# Patient Record
Sex: Male | Born: 1990 | Race: Black or African American | Hispanic: No | Marital: Single | Smoking: Never smoker
Health system: Southern US, Community
[De-identification: ages and names within clinical notes are randomized; demographics above are authoritative.]

## PROBLEM LIST (undated history)

## (undated) DIAGNOSIS — J45909 Unspecified asthma, uncomplicated: Secondary | ICD-10-CM

---
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
## 2013-08-07 ENCOUNTER — Encounter (HOSPITAL_COMMUNITY): Payer: Self-pay | Admitting: Emergency Medicine

## 2013-08-07 ENCOUNTER — Emergency Department (HOSPITAL_COMMUNITY)
Admission: EM | Admit: 2013-08-07 | Discharge: 2013-08-07 | Disposition: A | Discharge status: Home / Self Care | Payer: Managed Care, Other (non HMO) | Attending: Emergency Medicine | Admitting: Emergency Medicine

## 2013-08-07 DIAGNOSIS — J45909 Unspecified asthma, uncomplicated: Secondary | ICD-10-CM | POA: Insufficient documentation

## 2013-08-07 DIAGNOSIS — R630 Anorexia: Secondary | ICD-10-CM | POA: Insufficient documentation

## 2013-08-07 DIAGNOSIS — R5383 Other fatigue: Secondary | ICD-10-CM

## 2013-08-07 DIAGNOSIS — K922 Gastrointestinal hemorrhage, unspecified: Secondary | ICD-10-CM

## 2013-08-07 DIAGNOSIS — R112 Nausea with vomiting, unspecified: Secondary | ICD-10-CM | POA: Insufficient documentation

## 2013-08-07 DIAGNOSIS — R6883 Chills (without fever): Secondary | ICD-10-CM | POA: Insufficient documentation

## 2013-08-07 DIAGNOSIS — R109 Unspecified abdominal pain: Secondary | ICD-10-CM | POA: Insufficient documentation

## 2013-08-07 DIAGNOSIS — R197 Diarrhea, unspecified: Secondary | ICD-10-CM | POA: Insufficient documentation

## 2013-08-07 DIAGNOSIS — R5381 Other malaise: Secondary | ICD-10-CM | POA: Insufficient documentation

## 2013-08-07 HISTORY — DX: Unspecified asthma, uncomplicated: J45.909

## 2013-08-07 LAB — COMPREHENSIVE METABOLIC PANEL
ALBUMIN: 3.7 g/dL (ref 3.5–5.2)
ALT: 29 U/L (ref 0–53)
AST: 33 U/L (ref 0–37)
Alkaline Phosphatase: 76 U/L (ref 39–117)
BUN: 11 mg/dL (ref 6–23)
CALCIUM: 9.3 mg/dL (ref 8.4–10.5)
CO2: 22 meq/L (ref 19–32)
Chloride: 101 mEq/L (ref 96–112)
Creatinine, Ser: 0.87 mg/dL (ref 0.50–1.35)
GFR calc Af Amer: >90 mL/min (ref >90)
GFR calc non Af Amer: >90 mL/min (ref >90)
Glucose, Bld: 88 mg/dL (ref 70–99)
Potassium: 3.9 mEq/L (ref 3.7–5.3)
SODIUM: 137 meq/L (ref 137–147)
TOTAL PROTEIN: 8 g/dL (ref 6.0–8.3)
Total Bilirubin: 0.5 mg/dL (ref 0.3–1.2)

## 2013-08-07 LAB — CBC WITH DIFFERENTIAL/PLATELET
BASOS ABS: 0 10*3/uL (ref 0.0–0.1)
BASOS PCT: 0 % (ref 0–1)
EOS ABS: 0.2 10*3/uL (ref 0.0–0.7)
EOS PCT: 2 % (ref 0–5)
HCT: 43.1 % (ref 39.0–52.0)
Hemoglobin: 14.9 g/dL (ref 13.0–17.0)
LYMPHS PCT: 13 % (ref 12–46)
Lymphs Abs: 1.4 10*3/uL (ref 0.7–4.0)
MCH: 29.5 pg (ref 26.0–34.0)
MCHC: 34.6 g/dL (ref 30.0–36.0)
MCV: 85.3 fL (ref 78.0–100.0)
Monocytes Absolute: 1.2 10*3/uL — ABNORMAL HIGH (ref 0.1–1.0)
Monocytes Relative: 11 % (ref 3–12)
Neutro Abs: 7.8 10*3/uL — ABNORMAL HIGH (ref 1.7–7.7)
Neutrophils Relative %: 74 % (ref 43–77)
PLATELETS: 208 10*3/uL (ref 150–400)
RBC: 5.05 MIL/uL (ref 4.22–5.81)
RDW: 12.9 % (ref 11.5–15.5)
WBC: 10.7 10*3/uL — AB (ref 4.0–10.5)

## 2013-08-07 LAB — POC OCCULT BLOOD, ED: FECAL OCCULT BLD: POSITIVE — AB

## 2013-08-07 MED ORDER — SODIUM CHLORIDE 0.9 % IV BOLUS (SEPSIS)
1000.0000 mL | Freq: Once | INTRAVENOUS | Status: AC
  Administered 2013-08-07: 1000 mL via INTRAVENOUS

## 2013-08-07 MED ORDER — ONDANSETRON HCL 4 MG/2ML IJ SOLN
4.0000 mg | Freq: Once | INTRAMUSCULAR | Status: AC
  Administered 2013-08-07: 4 mg via INTRAVENOUS
  Filled 2013-08-07: qty 2

## 2013-08-07 MED ORDER — SULFAMETHOXAZOLE-TMP DS 800-160 MG PO TABS: 1.0000 | ORAL_TABLET | Freq: Two times a day (BID) | ORAL | Status: DC

## 2013-08-07 MED ORDER — OXYCODONE-ACETAMINOPHEN 5-325 MG PO TABS: 1.0000 | ORAL_TABLET | Freq: Four times a day (QID) | ORAL | Status: AC | PRN

## 2013-08-07 MED ORDER — MORPHINE SULFATE 4 MG/ML IJ SOLN
4.0000 mg | Freq: Once | INTRAMUSCULAR | Status: AC
  Administered 2013-08-07: 4 mg via INTRAVENOUS
  Filled 2013-08-07: qty 1

## 2013-08-07 NOTE — ED Notes (Signed)
Pt tolerated fluids well, no complaints of nausea or stomach pains

## 2013-08-07 NOTE — ED Provider Notes (Signed)
CSN: [id number]     Arrival date & time 08/07/13  1125 History   First MD Initiated Contact with Patient 08/07/13 1243     Chief Complaint  Patient presents with  . Rectal Bleeding  . Abdominal Pain     (Consider location/radiation/quality/duration/timing/severity/associated sxs/prior Treatment) Patient is a 23 y.o. male presenting with hematochezia and abdominal pain. The history is provided by the patient.  Rectal Bleeding Associated symptoms: abdominal pain and vomiting   Abdominal Pain Associated symptoms: chills, diarrhea, fatigue, hematochezia, nausea and vomiting   Associated symptoms: no chest pain and no shortness of breath    patient began with nausea and vomiting around 5 days ago. He also had diarrhea she states was initially drained. No nausea and vomiting is somewhat resolved, however he does have blood in the stool now. He states it is rather bloody but still watery. He also has mid abdominal pain. He states his abdomen is hurting, but he was hungry also. He set some chills without fever. His girlfriend has had a little bit of nausea and vomiting but no diarrhea. No other bleeding.  Past Medical History  Diagnosis Date  . Asthma    History reviewed. No pertinent past surgical history. No family history on file. History  Substance Use Topics  . Smoking status: Never Smoker   . Smokeless tobacco: Not on file  . Alcohol Use: No    Review of Systems  Constitutional: Positive for chills, appetite change and fatigue. Negative for activity change.  Eyes: Negative for pain.  Respiratory: Negative for chest tightness and shortness of breath.   Cardiovascular: Negative for chest pain and leg swelling.  Gastrointestinal: Positive for nausea, vomiting, abdominal pain, diarrhea, blood in stool and hematochezia.  Genitourinary: Negative for flank pain.  Musculoskeletal: Negative for back pain and neck stiffness.  Skin: Negative for rash.  Neurological: Negative for weakness,  numbness and headaches.  Psychiatric/Behavioral: Negative for behavioral problems.      Allergies  Review of patient's allergies indicates no known allergies.  Home Medications   Current Outpatient Rx  Name  Route  Sig  Dispense  Refill  . oxyCODONE-acetaminophen (PERCOCET/ROXICET) 5-325 MG per tablet   Oral   Take 1-2 tablets by mouth every 6 (six) hours as needed for severe pain.   10 tablet   0   . sulfamethoxazole-trimethoprim (BACTRIM DS) 800-160 MG per tablet   Oral   Take 1 tablet by mouth 2 (two) times daily.   6 tablet   0    BP 128/57  Pulse 93  Temp(Src) 97.9 F (36.6 C) (Oral)  Resp 18  SpO2 99% Physical Exam  Nursing note and vitals reviewed. Constitutional: He is oriented to person, place, and time. He appears well-developed and well-nourished.  HENT:  Head: Normocephalic and atraumatic.  Eyes: EOM are normal. Pupils are equal, round, and reactive to light.  Neck: Normal range of motion.  Cardiovascular: Normal rate, regular rhythm and normal heart sounds.   No murmur heard. Pulmonary/Chest: Effort normal and breath sounds normal.  Abdominal: Soft. He exhibits no distension and no mass. There is tenderness. There is no rebound and no guarding.  Mild diffuse abdominal tenderness without rebound guarding. No hernias palpated.  Musculoskeletal: Normal range of motion. He exhibits no edema.  Neurological: He is alert and oriented to person, place, and time. No cranial nerve deficit.  Skin: Skin is warm and dry.  Psychiatric: He has a normal mood and affect.    ED Course  Procedures (including critical care time) Labs Review Labs Reviewed  CBC WITH DIFFERENTIAL - Abnormal; Notable for the following:    WBC 10.7 (*)    Neutro Abs 7.8 (*)    Monocytes Absolute 1.2 (*)    All other components within normal limits  POC OCCULT BLOOD, ED - Abnormal; Notable for the following:    Fecal Occult Bld POSITIVE (*)    All other components within normal limits   COMPREHENSIVE METABOLIC PANEL   Imaging Review No results found.   EKG Interpretation None      MDM   Final diagnoses:  Nausea vomiting and diarrhea  GI bleed    Patient nausea vomiting diarrhea. Has had blood in the stool. The nausea and vomiting has improved over the last 2 days, but the diarrhea remains with some blood. Lab work reassuring. May be infectious diarrhea. Will give some Bactrim. Will have follow with gastroenterology.    Juliet Rude. Rubin Payor, MD 08/07/13 [phone]

## 2013-08-07 NOTE — Progress Notes (Signed)
   CARE MANAGEMENT ED NOTE 08/07/2013  [facility]   Account Number:  [id number]  Date Initiated:  08/07/2013  Documentation initiated by:  Radford PaxFERRERO,Chaley Castellanos  Subjective/Objective Assessment:   Patient presents to Ed with abdominal pain and rectal bleeding     Subjective/Objective Assessment Detail:     Action/Plan:   Action/Plan Detail:   Anticipated DC Date:  08/07/2013     Status Recommendation to Physician:   Result of Recommendation:    Other ED Services  Consult Working Plan    DC Planning Services  Other  PCP issues    Choice offered to / List presented to:            Status of service:  Completed, signed off  ED Comments:   ED Comments Detail:  EDCM spoke to patient at bedside.  Patient confirms he doe snot have a pcp.  EDCM instructed patient to call the phone number on the back of his insurance card or go to insurance company website to help him find a physician who is close to him and within network.  Patient verbalized understanding.  No further EDCM needs at this time.

## 2013-08-07 NOTE — Discharge Instructions (Signed)
Bloody Diarrhea  Bloody diarrhea can be caused by many different conditions. Most of the time bloody diarrhea is the result of food poisoning or minor infections. Bloody diarrhea usually improves over 2 to 3 days of rest and fluid replacement. Other conditions that can cause bloody diarrhea include:   Internal bleeding.   Infection.   Diseases of the bowel and colon.  Internal bleeding from an ulcer or bowel disease can be severe and requires hospital care or even surgery.  DIAGNOSIS   To find out what is wrong your caregiver may check your:   Stool.   Blood.   Results from a test that looks inside the body (endoscopy).  TREATMENT    Get plenty of rest.   Drink enough water and fluids to keep your urine clear or pale yellow.   Do not smoke.   Solid foods and dairy products should be avoided until your illness improves.   As you improve, slowly return to a regular diet with easily-digested foods first. Examples are:   Bananas.   Rice.   Toast.   Crackers.  You should only need these for about 2 days before adding more normal foods to your diet.   Avoid spicy or fatty foods as well as caffeine and alcohol for several days.   Medicine to control cramping and diarrhea can relieve symptoms but may prolong some cases of bloody diarrhea. Antibiotics can speed recovery from diarrhea due to some bacterial infections. Call your caregiver if diarrhea does not get better in 3 days.  SEEK MEDICAL CARE IF:    You do not improve after 3 days.   Your diarrhea improves but your stool appears black.  SEEK IMMEDIATE MEDICAL CARE IF:    You become extremely weak or faint.   You become very sweaty.   You have increased pain or bleeding.   You develop repeated vomiting.   You vomit and you see blood or the vomit looks black in color.   You have a fever.  Document Released: 04/24/2005 Document Revised: 07/17/2011 Document Reviewed: 03/26/2009  [employer] Patient Information 2014 [employer].

## 2013-08-07 NOTE — ED Notes (Signed)
Pt c/o abd pain x 4 days and rectal bleeding (bright red) x 2 days.  No hx of abd problems or surgery. Pt also c/o n/v/d. Last vomited 2 days ago

## 2013-08-07 NOTE — ED Notes (Signed)
Initial Contact - Pt given gingerale, tolerating well at this time. Denies further needs/complaints.  NAD.

## 2013-10-26 ENCOUNTER — Encounter (HOSPITAL_COMMUNITY): Payer: Self-pay | Admitting: Emergency Medicine

## 2013-10-26 ENCOUNTER — Emergency Department (HOSPITAL_COMMUNITY)
Admission: EM | Admit: 2013-10-26 | Discharge: 2013-10-27 | Disposition: A | Discharge status: Home / Self Care | Payer: Managed Care, Other (non HMO) | Attending: Emergency Medicine | Admitting: Emergency Medicine

## 2013-10-26 DIAGNOSIS — J45909 Unspecified asthma, uncomplicated: Secondary | ICD-10-CM | POA: Insufficient documentation

## 2013-10-26 DIAGNOSIS — IMO0001 Reserved for inherently not codable concepts without codable children: Secondary | ICD-10-CM | POA: Insufficient documentation

## 2013-10-26 DIAGNOSIS — L03119 Cellulitis of unspecified part of limb: Principal | ICD-10-CM

## 2013-10-26 DIAGNOSIS — L03116 Cellulitis of left lower limb: Secondary | ICD-10-CM

## 2013-10-26 DIAGNOSIS — L02419 Cutaneous abscess of limb, unspecified: Secondary | ICD-10-CM | POA: Insufficient documentation

## 2013-10-26 NOTE — ED Notes (Signed)
patient c/o left knee pain, denies injury. Patient c/o sharp pain with ambulation. Patient states his knee has been warm to the touch and feet have been going numb while laying in bed.

## 2013-10-27 ENCOUNTER — Emergency Department (HOSPITAL_COMMUNITY): Payer: Managed Care, Other (non HMO)

## 2013-10-27 LAB — BASIC METABOLIC PANEL
BUN: 16 mg/dL (ref 6–23)
CO2: 26 mEq/L (ref 19–32)
CREATININE: 1 mg/dL (ref 0.50–1.35)
Calcium: 9.1 mg/dL (ref 8.4–10.5)
Chloride: 94 mEq/L — ABNORMAL LOW (ref 96–112)
GLUCOSE: 83 mg/dL (ref 70–99)
Potassium: 3.4 mEq/L — ABNORMAL LOW (ref 3.7–5.3)
Sodium: 134 mEq/L — ABNORMAL LOW (ref 137–147)

## 2013-10-27 LAB — CBC WITH DIFFERENTIAL/PLATELET
BASOS PCT: 0 % (ref 0–1)
Basophils Absolute: 0 10*3/uL (ref 0.0–0.1)
EOS ABS: 0.1 10*3/uL (ref 0.0–0.7)
Eosinophils Relative: 0 % (ref 0–5)
HEMATOCRIT: 38.8 % — AB (ref 39.0–52.0)
HEMOGLOBIN: 13.4 g/dL (ref 13.0–17.0)
LYMPHS ABS: 3.6 10*3/uL (ref 0.7–4.0)
Lymphocytes Relative: 17 % (ref 12–46)
MCH: 29.5 pg (ref 26.0–34.0)
MCHC: 34.5 g/dL (ref 30.0–36.0)
MCV: 85.3 fL (ref 78.0–100.0)
MONO ABS: 1.5 10*3/uL — AB (ref 0.1–1.0)
MONOS PCT: 7 % (ref 3–12)
Neutro Abs: 15.5 10*3/uL — ABNORMAL HIGH (ref 1.7–7.7)
Neutrophils Relative %: 76 % (ref 43–77)
Platelets: 258 10*3/uL (ref 150–400)
RBC: 4.55 MIL/uL (ref 4.22–5.81)
RDW: 12.9 % (ref 11.5–15.5)
WBC: 20.6 10*3/uL — ABNORMAL HIGH (ref 4.0–10.5)

## 2013-10-27 MED ORDER — MORPHINE SULFATE 4 MG/ML IJ SOLN
4.0000 mg | Freq: Once | INTRAMUSCULAR | Status: AC
  Administered 2013-10-27: 4 mg via INTRAVENOUS
  Filled 2013-10-27: qty 1

## 2013-10-27 MED ORDER — SULFAMETHOXAZOLE-TRIMETHOPRIM 800-160 MG PO TABS: 1.0000 | ORAL_TABLET | Freq: Two times a day (BID) | ORAL | Status: AC

## 2013-10-27 MED ORDER — VANCOMYCIN HCL IN DEXTROSE 1-5 GM/200ML-% IV SOLN
1000.0000 mg | Freq: Once | INTRAVENOUS | Status: AC
  Administered 2013-10-27: 1000 mg via INTRAVENOUS
  Filled 2013-10-27: qty 200

## 2013-10-27 MED ORDER — CEPHALEXIN 500 MG PO CAPS: 500.0000 mg | ORAL_CAPSULE | Freq: Four times a day (QID) | ORAL | Status: AC

## 2013-10-27 MED ORDER — SODIUM CHLORIDE 0.9 % IV BOLUS (SEPSIS)
1000.0000 mL | Freq: Once | INTRAVENOUS | Status: AC
  Administered 2013-10-27: 1000 mL via INTRAVENOUS

## 2013-10-27 NOTE — Discharge Instructions (Signed)
Take Bactrim and Keflex as prescribed. Followup for a recheck with your primary care provider in 48 hours. Return to the emergency department if symptoms worsen.  Cellulitis Cellulitis is an infection of the skin and the tissue beneath it. The infected area is usually red and tender. Cellulitis occurs most often in the arms and lower legs.  CAUSES  Cellulitis is caused by bacteria that enter the skin through cracks or cuts in the skin. The most common types of bacteria that cause cellulitis are Staphylococcus and Streptococcus. SYMPTOMS   Redness and warmth.  Swelling.  Tenderness or pain.  Fever. DIAGNOSIS  Your caregiver can usually determine what is wrong based on a physical exam. Blood tests may also be done. TREATMENT  Treatment usually involves taking an antibiotic medicine. HOME CARE INSTRUCTIONS   Take your antibiotics as directed. Finish them even if you start to feel better.  Keep the infected arm or leg elevated to reduce swelling.  Apply a warm cloth to the affected area up to 4 times per day to relieve pain.  Only take over-the-counter or prescription medicines for pain, discomfort, or fever as directed by your caregiver.  Keep all follow-up appointments as directed by your caregiver. SEEK MEDICAL CARE IF:   You notice red streaks coming from the infected area.  Your red area gets larger or turns dark in color.  Your bone or joint underneath the infected area becomes painful after the skin has healed.  Your infection returns in the same area or another area.  You notice a swollen bump in the infected area.  You develop new symptoms. SEEK IMMEDIATE MEDICAL CARE IF:   You have a fever.  You feel very sleepy.  You develop vomiting or diarrhea.  You have a general ill feeling (malaise) with muscle aches and pains. MAKE SURE YOU:   Understand these instructions.  Will watch your condition.  Will get help right away if you are not doing well or get  worse. Document Released: 02/01/2005 Document Revised: 10/24/2011 Document Reviewed: 07/10/2011 [facility] Patient Information 2015 [place], [place]. This information is not intended to replace advice given to you by your health care provider. Make sure you discuss any questions you have with your health care provider.

## 2013-10-27 NOTE — ED Provider Notes (Signed)
Medical screening examination/treatment/procedure(s) were performed by non-physician practitioner and as supervising physician I was immediately available for consultation/collaboration.   EKG Interpretation None        David Yelverton, MD 10/27/13 0645 

## 2013-10-27 NOTE — ED Provider Notes (Signed)
0400 - Patient care assumed from Coral CeoJessica Palmer, PA-C at shift change. 23 year old male presenting today for symptoms consistent with cellulitis. Patient denies fever and he is afebrile in ED today. Plan discussed with Rubye OaksPalmer, PA-C at shift change which includes followup in labs and discharged with Bactrim and Keflex as well as [facility]   Labs reviewed which are significant for leukocytosis of 20.6. Patient examined by myself. Patient noted to have significant area of erythema spanning the, almost entire, length of his anterior left lower extremity with mild induration and moderate heat to touch. No red linear streaking. Decision was made to give initial dose of antibiotics intravenously. 1 g IV Vancomycin ordered and infused. Patient stable and appropriate for discharge with prescriptions for Bactrim and Keflex. Have discussed reasons to return to the emergency department with the patient as well as instruction for recheck in 48 hours. Patient verbalizes comfort and understanding with this plan with no unaddressed concerns.   Antony MaduraKelly Humes, PA-C 10/27/13 0403

## 2013-10-27 NOTE — ED Provider Notes (Signed)
CSN: [id number]     Arrival date & time 10/26/13  2330 History   First MD Initiated Contact with Patient 10/27/13 0001     Chief Complaint  Patient presents with  . Knee Pain    left x 3 days   HPI  Wesley Rodriguez is a 23 y.o. male with a PMH of asthma who presents to the ED for evaluation of knee pain. History was provided by the patient. Patient has had increasing left knee pain for the past 3 days. Pain is located below his left knee. Patient complains of an intermittent sharp pain which is worse with ambulation. He denies any injuries or trauma. He also complains of left anterior leg swelling and redness. No wounds or lacerations. No calf pain. No fevers, chills, change in appetite/activities, vomiting, fatigue, weakness. No history of DVT, PE, clotting disorder, cancer, recent surgery, immobilization, travel.    Past Medical History  Diagnosis Date  . Asthma    History reviewed. No pertinent past surgical history. No family history on file. History  Substance Use Topics  . Smoking status: Never Smoker   . Smokeless tobacco: Not on file  . Alcohol Use: No    Review of Systems  Constitutional: Negative for fever, chills, diaphoresis, activity change, appetite change and fatigue.  Respiratory: Negative for shortness of breath.   Cardiovascular: Positive for leg swelling. Negative for chest pain.  Gastrointestinal: Negative for nausea, vomiting and abdominal pain.  Musculoskeletal: Positive for myalgias (left leg). Negative for arthralgias, back pain, gait problem and joint swelling.  Skin: Positive for color change. Negative for wound.  Neurological: Negative for weakness and numbness.    Allergies  Review of patient's allergies indicates no known allergies.  Home Medications   Prior to Admission medications   Medication Sig Start Date End Date Taking? Authorizing Provider  oxyCODONE-acetaminophen (PERCOCET/ROXICET) 5-325 MG per tablet Take 1-2 tablets by mouth every 6  (six) hours as needed for severe pain. 08/07/13   Juliet RudeNathan R. Pickering, MD  sulfamethoxazole-trimethoprim (BACTRIM DS) 800-160 MG per tablet Take 1 tablet by mouth 2 (two) times daily. 08/07/13   Juliet RudeNathan R. Pickering, MD   BP 130/72  Pulse 114  Temp(Src) 97.4 F (36.3 C) (Oral)  Resp 18  SpO2 100%  Filed Vitals:   10/26/13 2343 10/27/13 0025 10/27/13 0245 10/27/13 0356  BP: 130/72 131/58 95/60 90/60   Pulse: 114 99 106 89  Temp: 97.4 F (36.3 C) 98.3 F (36.8 C) 98.1 F (36.7 C) 98.1 F (36.7 C)  TempSrc: Oral Oral Oral Oral  Resp: 18 20 18 16   SpO2: 100% 100% 100% 97%    Physical Exam  Nursing note and vitals reviewed. Constitutional: He is oriented to person, place, and time. He appears well-developed and well-nourished. No distress.  Non-toxic  HENT:  Head: Normocephalic and atraumatic.  Right Ear: External ear normal.  Left Ear: External ear normal.  Mouth/Throat: Oropharynx is clear and moist.  Eyes: Conjunctivae are normal. Right eye exhibits no discharge. Left eye exhibits no discharge.  Neck: Neck supple.  Cardiovascular: Normal rate, regular rhythm, normal heart sounds and intact distal pulses.  Exam reveals no gallop and no friction rub.   No murmur heard. Dorsalis pedis pulses present and equal bilaterally  Pulmonary/Chest: Effort normal and breath sounds normal. No respiratory distress. He has no wheezes. He has no rales. He exhibits no tenderness.  Abdominal: Soft. He exhibits no distension. There is no tenderness.  Musculoskeletal: Normal range of motion. He exhibits edema.  He exhibits no tenderness.       Legs: No left knee edema, joint effusion, erythema or tenderness. Tenderness to palpation inferior to the left tibial tubercle with surrounding erythema and edema. No abscess, mass, or induration. Area warm to the touch. Mild blanchable erythema extends from this area inferiorly and stops at the distal 1/3 of the left lower leg. No streaking. No calf tenderness,  edema or erythema. Erythema and edema localized to the anterior left leg. No pedal edema. Patient able to ambulate with a mild antalgic gait. ROM intact in the left LE. No wounds throughout.   Neurological: He is alert and oriented to person, place, and time.  Sensation intact in the LE   Skin: Skin is warm and dry. He is not diaphoretic. There is erythema.    ED Course  Procedures (including critical care time) Labs Review Labs Reviewed - No data to display  Imaging Review Dg Knee Complete 4 Views Left  10/27/2013   CLINICAL DATA:  Left knee pain  EXAM: LEFT KNEE - COMPLETE 4+ VIEW  COMPARISON:  None.  FINDINGS: There is no evidence of fracture, dislocation, or joint effusion. There is no evidence of arthropathy or other focal bone abnormality. Soft tissues are unremarkable.  IMPRESSION: Negative.   Electronically Signed   By: Herbie BaltimoreWalt  Liebkemann M.D.   On: 10/27/2013 00:41     EKG Interpretation None      MDM   Wesley Rodriguez is a 23 y.o. male with a PMH of asthma who presents to the ED for evaluation of leg pain. This is likely due to a developing cellulitis of the left leg. Patient afebrile and non-toxic in appearance. No hx of trauma or wounds. No evidence of a left knee joint effusion or septic joint. No calf tenderness or edema. Erythema and edema is localized anteriorly. DVT unlikely. Patient has no risk factors for this or concerning physical exam findings. Will obtain labs. Left knee x-ray ordered by nursing staff prior to my exam which is negative. Will give dose of Vancomycin. Area of erytheama marked with surgical pen in the ED. Signed-out care to TRW AutomotiveKelly Humes PA-C to await labs and discharge after IV antibiotics.    Final impressions: 1. Cellulitis of left lower leg       Greer EeJessica Katlin Palmer PA-C           Jillyn LedgerJessica K Palmer, [place] JerseyPA-C 10/27/13 2059

## 2013-10-28 NOTE — ED Provider Notes (Signed)
Medical screening examination/treatment/procedure(s) were performed by non-physician practitioner and as supervising physician I was immediately available for consultation/collaboration.   EKG Interpretation None        Dagmar HaitWilliam Blair Walden, MD 10/28/13 [phone]

## 2015-12-19 IMAGING — CR DG KNEE COMPLETE 4+V*L*
4 series · 4 of 4 positions shown · non-contrast
Comparison: None.

CLINICAL DATA: Left knee pain

EXAM:
LEFT KNEE - COMPLETE 4+ VIEW

[x knee ap left (1 of 3)]
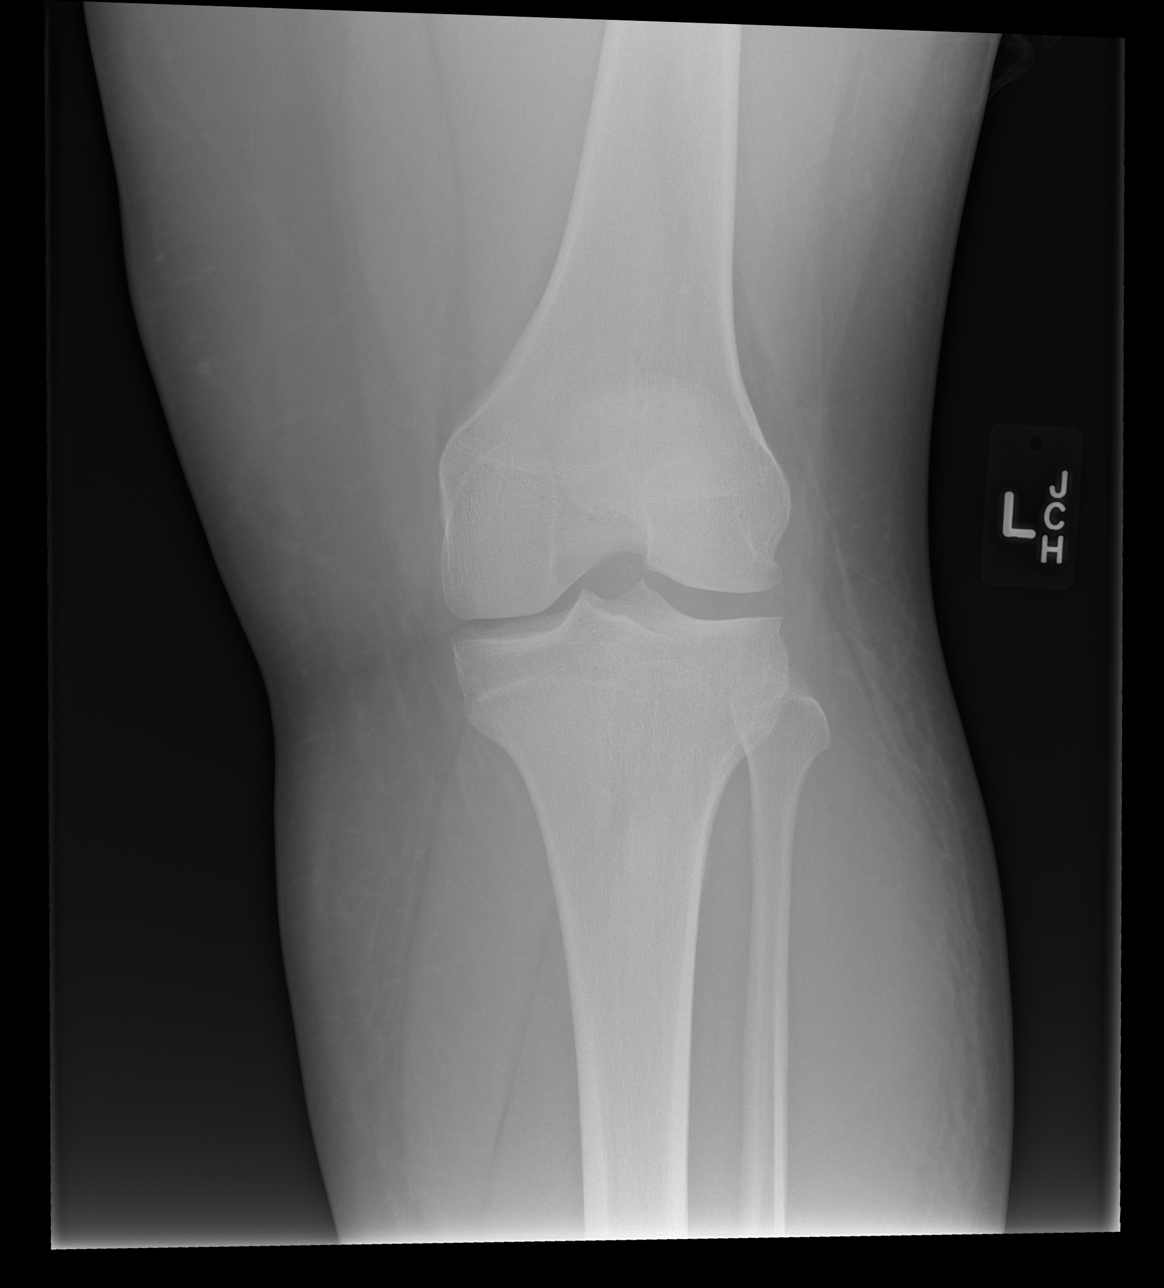

[x knee ap left (2 of 3)]
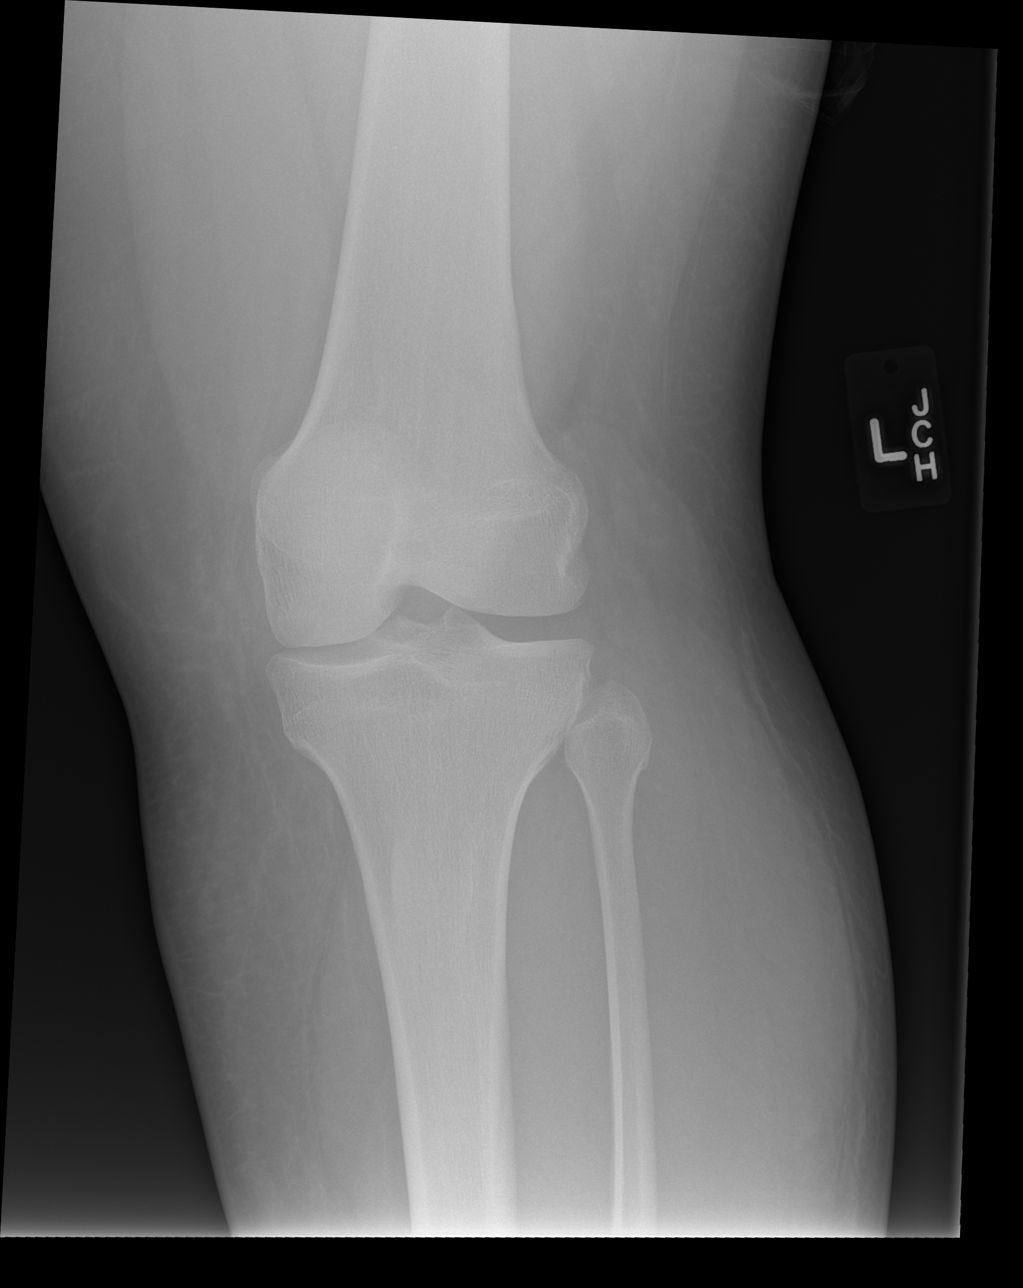

[x knee ap left (3 of 3)]
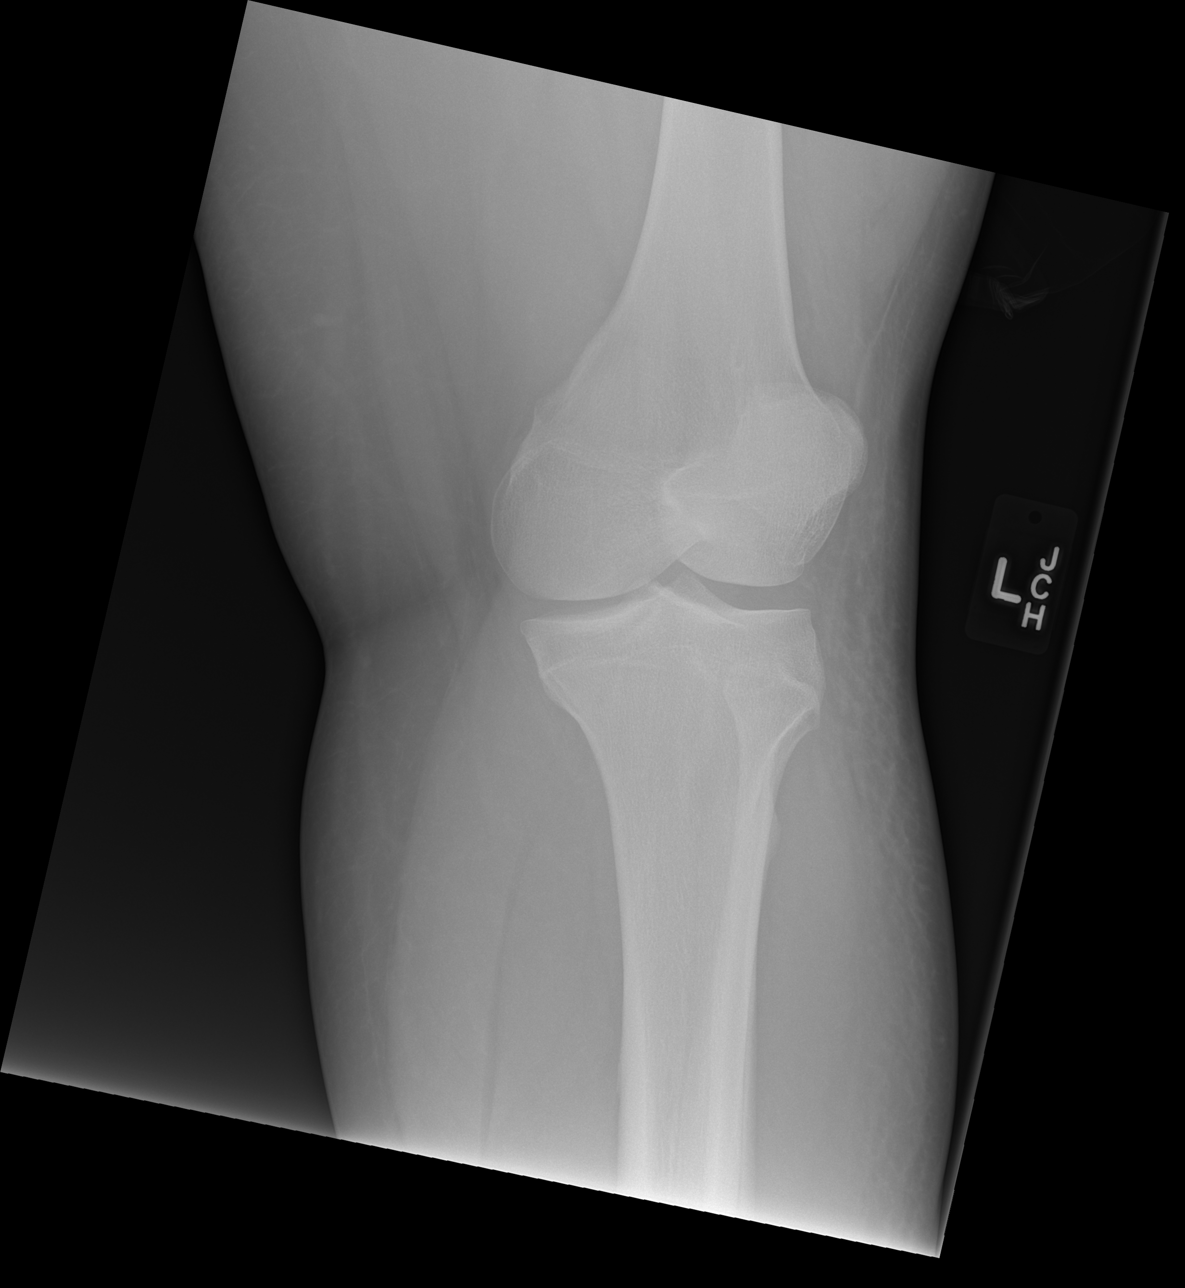

[x knee lat left]
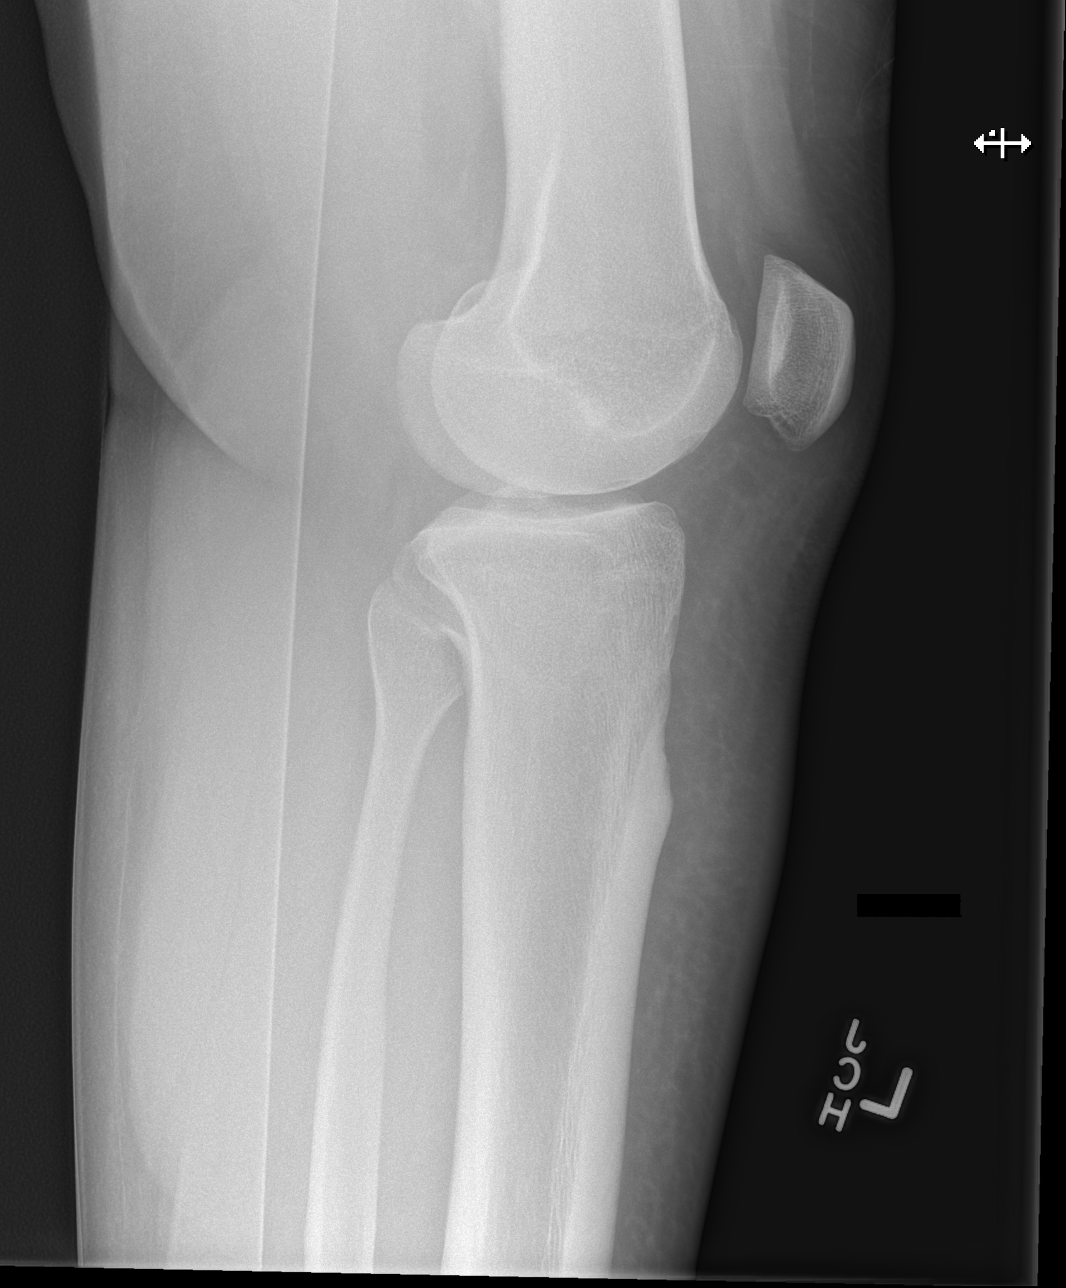

[4 of 4 positions shown; findings below may reference images not displayed]

FINDINGS: There is no evidence of fracture, dislocation, or joint effusion.
There is no evidence of arthropathy or other focal bone abnormality.
Soft tissues are unremarkable.
IMPRESSION: Negative.
